# Patient Record
Sex: Female | Born: 1950 | Race: White | Hispanic: No
Health system: Northeastern US, Academic
[De-identification: ages and names within clinical notes are randomized; demographics above are authoritative.]

---

## 2022-05-08 ENCOUNTER — Inpatient Hospital Stay
Admit: 2022-05-08 | Discharge: 2022-05-09 | Disposition: A | Discharge status: Home / Self Care | Payer: PRIVATE HEALTH INSURANCE | Attending: Emergency Medicine

## 2022-05-08 ENCOUNTER — Emergency Department: Admit: 2022-05-08 | Payer: PRIVATE HEALTH INSURANCE

## 2022-05-08 DIAGNOSIS — J069 Acute upper respiratory infection, unspecified: Secondary | ICD-10-CM

## 2022-05-08 DIAGNOSIS — J0100 Acute maxillary sinusitis, unspecified: Secondary | ICD-10-CM

## 2022-05-08 LAB — COMPREHENSIVE METABOLIC PANEL
ALT: 23 U/L (ref 0–55)
AST: 22 U/L (ref 6–42)
Albumin: 3.4 g/dL (ref 3.2–5.0)
Alkaline phosphatase: 99 U/L (ref 30–130)
Anion Gap: 5 mmol/L (ref 3–14)
BUN: 22 mg/dL (ref 6–24)
Bilirubin, total: 0.2 mg/dL (ref 0.2–1.2)
CO2 (Bicarbonate): 29 mmol/L (ref 20–32)
Calcium: 9.2 mg/dL (ref 8.5–10.5)
Chloride: 105 mmol/L (ref 98–110)
Creatinine: 0.69 mg/dL (ref 0.55–1.30)
Glucose: 97 mg/dL (ref 70–110)
Potassium: 3.8 mmol/L (ref 3.6–5.2)
Protein, total: 7.6 g/dL (ref 6.0–8.4)
Sodium: 139 mmol/L (ref 135–146)
eGFRcr: 93 mL/min/{1.73_m2} (ref >=60)

## 2022-05-08 LAB — CBC WITH DIFFERENTIAL
Basophils %: 0.4 %
Basophils Absolute: 0.03 10*3/uL (ref 0.00–0.22)
Eosinophils %: 0.6 %
Eosinophils Absolute: 0.04 10*3/uL (ref 0.00–0.50)
Hematocrit: 40.2 % (ref 32.0–47.0)
Hemoglobin: 12.7 g/dL (ref 11.0–16.0)
Immature Granulocytes %: 0.4 %
Immature Granulocytes Absolute: 0.03 10*3/uL (ref 0.00–0.10)
Lymphocyte %: 17.6 %
Lymphocytes Absolute: 1.22 10*3/uL (ref 0.70–4.00)
MCH: 30.5 pg (ref 26.0–34.0)
MCHC: 31.6 g/dL (ref 31.0–37.0)
MCV: 96.6 fL (ref 80.0–100.0)
MPV: 9.8 fL (ref 9.1–12.4)
Monocytes %: 6.1 %
Monocytes Absolute: 0.42 10*3/uL (ref 0.36–0.77)
NRBC %: 0 % (ref 0.0–0.0)
NRBC Absolute: 0 10*3/uL (ref 0.00–2.00)
Neutrophil %: 74.9 %
Neutrophils Absolute: 5.18 10*3/uL (ref 1.50–7.95)
Platelets: 258 10*3/uL (ref 150–400)
RBC: 4.16 M/uL (ref 3.70–5.20)
RDW-CV: 13.1 % (ref 11.5–14.5)
RDW-SD: 46.7 fL (ref 35.0–51.0)
WBC: 6.9 10*3/uL (ref 4.0–11.0)

## 2022-05-08 MED ORDER — amoxicillin-pot clavulanate (Augmentin) 875-125 mg tablet
875-125 | ORAL_TABLET | Freq: Two times a day (BID) | ORAL | 0 refills | 7.00000 days | Status: AC
Start: 2022-05-08 — End: 2022-05-18

## 2022-05-08 MED ORDER — acetaminophen (Tylenol) 325 mg tablet  - Omnicell Override Pull
325 | ORAL | Status: AC
Start: 2022-05-08 — End: ?

## 2022-05-08 MED ORDER — acetaminophen (Tylenol) tablet 650 mg
325 | Freq: Once | ORAL | Status: AC
Start: 2022-05-08 — End: 2022-05-08
  Administered 2022-05-08: 23:00:00 650 mg via ORAL

## 2022-05-08 MED FILL — ACETAMINOPHEN 325 MG TABLET: 325 325 mg | ORAL | Qty: 2

## 2022-05-08 NOTE — Other (Signed)
Patient Education  Table of Contents   Sinus Infection, Adult   Infec?o sinusal, adultos (Sinus Infection, Adult)   Upper Respiratory Infection, Adult   Infec?o das vias a?reas superiores, adultos (Upper Respiratory Infection, Adult)    To view videos and all your education online visit,  [email or web address]  or scan this QR code with your smartphone.  Access to this content will expire in one year.  Sinus Infection, Adult    A sinus infection, also called sinusitis, is inflammation of your sinuses. Sinuses are hollow spaces in the bones around your face. Your sinuses are located:   Around your eyes.   In the middle of your forehead.   Behind your nose.   In your cheekbones.  Mucus normally drains out of your sinuses. When your nasal tissues become inflamed or swollen, mucus can become trapped or blocked. This allows bacteria, viruses, and fungi to grow, which leads to infection. Most infections of the sinuses are caused by a virus.  A sinus infection can develop quickly. It can last for up to 4 weeks (acute) or for more than 12 weeks (chronic). A sinus infection often develops after a cold.  What are the causes?  This condition is caused by anything that creates swelling in the sinuses or stops mucus from draining. This includes:   Allergies.   Asthma.   Infection from bacteria or viruses.   Deformities or blockages in your nose or sinuses.   Abnormal growths in the nose (nasal polyps).   Pollutants, such as chemicals or irritants in the air.   Infection from fungi. This is rare.  What increases the risk?  You are more likely to develop this condition if you:   Have a weak body defense system (immune system).   Do a lot of swimming or diving.   Overuse nasal sprays.   Smoke.  What are the signs or symptoms?  The main symptoms of this condition are pain and a feeling of pressure around the affected sinuses. Other symptoms include:   Stuffy nose or congestion that makes it difficult to breathe through  your nose.   Thick yellow or greenish drainage from your nose.   Tenderness, swelling, and warmth over the affected sinuses.   A cough that may get worse at night.   Decreased sense of smell and taste.   Extra mucus that collects in the throat or the back of the nose (postnasal drip) causing a sore throat or bad breath.   Tiredness (fatigue).   Fever.  How is this diagnosed?  This condition is diagnosed based on:   Your symptoms.   Your medical history.   A physical exam.   Tests to find out if your condition is acute or chronic. This may include:  ? Checking your nose for nasal polyps.  ? Viewing your sinuses using a device that has a light (endoscope).  ? Testing for allergies or bacteria.  ? Imaging tests, such as an MRI or CT scan.  In rare cases, a bone biopsy may be done to rule out more serious types of fungal sinus disease.  How is this treated?  Treatment for a sinus infection depends on the cause and whether your condition is chronic or acute.   If caused by a virus, your symptoms should go away on their own within 10 days. You may be given medicines to relieve symptoms. They include:  ? Medicines that shrink swollen nasal passages (decongestants).  ? A spray that eases inflammation  of the nostrils (topical intranasal corticosteroids).  ? Rinses that help get rid of thick mucus in your nose (nasal saline washes).  ? Medicines that treat allergies (antihistamines).  ? Over-the-counter pain relievers.   If caused by bacteria, your health care provider may recommend waiting to see if your symptoms improve. Most bacterial infections will get better without antibiotic medicine. You may be given antibiotics if you have:  ? A severe infection.   ? A weak immune system.   If caused by narrow nasal passages or nasal polyps, surgery may be needed.  Follow these instructions at home:  Medicines   Take, use, or apply over-the-counter and prescription medicines only as told by your health care provider. These may  include nasal sprays.   If you were prescribed an antibiotic medicine, take it as told by your health care provider. Do not stop taking the antibiotic even if you start to feel better.  Hydrate and humidify     Drink enough fluid to keep your urine pale yellow. Staying hydrated will help to thin your mucus.   Use a cool mist humidifier to keep the humidity level in your home above 50%.   Inhale steam for 10?15 minutes, 3?4 times a day, or as told by your health care provider. You can do this in the bathroom while a hot shower is running.   Limit your exposure to cool or dry air.  Rest   Rest as much as possible.   Sleep with your head raised (elevated).   Make sure you get enough sleep each night.  General instructions     Apply a warm, moist washcloth to your face 3?4 times a day or as told by your health care provider. This will help with discomfort.   Use nasal saline washes as often as told by your health care provider.   Wash your hands often with soap and water to reduce your exposure to germs. If soap and water are not available, use hand sanitizer.   Do not smoke. Avoid being around people who are smoking (secondhand smoke).   Keep all follow-up visits. This is important.  Contact a health care provider if:   You have a fever.   Your symptoms get worse.   Your symptoms do not improve within 10 days.  Get help right away if:   You have a severe headache.   You have persistent vomiting.   You have severe pain or swelling around your face or eyes.   You have vision problems.   You develop confusion.   Your neck is stiff.   You have trouble breathing.  These symptoms may be an emergency. Get help right away. Call 911.   Do not wait to see if the symptoms will go away.   Do not drive yourself to the hospital.  Summary   A sinus infection is soreness and inflammation of your sinuses. Sinuses are hollow spaces in the bones around your face.   This condition is caused by nasal tissues that become inflamed or  swollen. The swelling traps or blocks the flow of mucus. This allows bacteria, viruses, and fungi to grow, which leads to infection.   If you were prescribed an antibiotic medicine, take it as told by your health care provider. Do not stop taking the antibiotic even if you start to feel better.   Keep all follow-up visits. This is important.  This information is not intended to replace advice given to you by your  health care provider. Make sure you discuss any questions you have with your health care provider.  Document Released: 2005-02-12 Document Updated: 2021-01-17 Document Reviewed: 2021-01-17  Elsevier Patient Education ? 2024 Elsevier Inc.  Infec?o sinusal, adultos  Sinus Infection, Adult    Uma infec?o sinusal, tamb?m chamada de sinusite,  a inflama?o dos seios paranasais. Os seios paranasais s?o espa?os ocos nos ossos do rosto. Seus seios paranasais est?o localizados:   Ao redor [employer].   No meio da testa.   Atr?s do nariz.   Nos ossos das bochechas.  O muco normalmente  drenado para fora de seus seios paranasais. Quando seus tecidos nasais ficam inflamados ou inchados, o muco pode ficar preso ou bloqueado. Isso permite que bact?rias, v?rus e fungos se proliferem, o que provoca infec?o. A maioria das infec?es dos seios  causada por um v?rus.  A infec?o sinusal pode se desenvolver rapidamente. Pode durar at [employer] (aguda) ou mais de 12 semanas (cr?nica). A infec?o sinusal muitas vezes surge ap?s um resfriado.  Quais s?o as causas?  Esse quadro cl?nico  causado por qualquer coisa que produza um incha?o nos seios paranasais ou que impe?a que o muco seja drenado. Isso inclui:   Alergias.   Asma.   Infec?o por bact?rias ou v?rus.   Deformidades ou bloqueios no nariz ou nos seios paranasais.   Tumores anormais no nariz (p?lipos nasais).   Poluentes, como subst?ncias qu?micas ou irritantes presentes no ar.   Infec?o por fungos. Isso  raro.  O que aumenta o risco?  Voc ter maior probabilidade  de apresentar esse quadro cl?nico se:   Nurse, mental health com o sistema de defesa do corpo (sistema imune) enfraquecido.   Nadar ou mergulhar frequentemente.   Usar em Tree surgeon.   Fumar.  Quais s?o os sinais ou sintomas?  Os principais sintomas desse quadro cl?nico s?o dor e uma sensa?o de press?o na ?rea dos seios paranasais afetados. Outros sintomas incluem:   Nariz entupido ou congest?o que dificulta a respira?o pelo nariz.   Secre?o espessa amarela ou esverdeada do nariz.   Sensibilidade, incha?o e aumento da temperatura nos seios paranasais afetados.   Uma tosse que pode piorar  noite.   Redu?o do olfato e do paladar.   Excesso de muco que se acumula na garganta ou na parte posterior do nariz (gotejamento p?s-nasal), causando dor de garganta ou mau h?lito.   Cansa?o (fadiga).   Febre.  Como esse quadro cl?nico  diagnosticado?  Esse quadro cl?nico  diagnosticado com base em:   Seus sintomas.   Seu hist?rico m?dico.   Um exame f?sico.   Exames para descobrir se seu quadro cl?nico  agudo ou cr?nico. Isso pode incluir:  ? Examinar o nariz para ver se h p?lipos nasais.  ? Examinar a parte interior de seus seios paranasais usando um dispositivo com uma luz (endosc?pio).  ? Fazer testes para alergias ou bact?rias.  ? Exames de imagem, como uma resson?ncia magn?tica (RM) ou tomografia computadorizada (TC).  Em casos raros, uma bi?psia ?ssea poder ser realizada para descartar tipos mais s?rios de doen?as f?ngicas dos seios paranasais.  Como esse quadro cl?nico  tratado?  O tratamento da infec?o sinusal depende da causa do seu quadro cl?nico e de ele ser cr?nico ou agudo.   Se foi causada por um v?rus, seus sintomas devem desaparecer sozinhos dentro de 10 dias. Voc poder receber medicamentos para aliviar seus sintomas. Isso inclui:  ? Medicamentos que melhoram as cavidades nasais inchadas (descongestionantes).  ?  Um spray que alivia a [employer]	[place] (corticosteroides intranasais  t?picos).  ? Enxaguantes que ajudam a eliminar o muco espesso no nariz (soro fisiol?gico nasal).  ? Medicamentos para tratar alergias (anti-histam?nicos).  ? Analg?sicos sem necessidade de receita m?dica.   Se o quadro for causado por bact?rias, seu m?dico pode recomendar esperar para ver se os seus sintomas melhoram. A maioria das infec?es bacterianas melhora sem necessidade de antibi?tico. Voc pode ter que tomar antibi?tico se tiver:  ? Uma infec?o grave.   ? Um sistema imune enfraquecido.   Se o quadro for causado por cavidades nasais estreitas ou p?lipos nasais, cirurgia pode ser necess?ria.  Siga estas instru?es em casa:  [employer], use ou aplique medicamentos de venda livre ou vendidos com receita m?dica somente de acordo com as indica?es do seu m?dico. Esses medicamentos podem incluir sprays nasais.   Caso tenha recebido uma prescri?o de antibi?tico, tome-o conforme a orienta?o do seu m?dico. N?o pare de tomar o antibi?tico mesmo se come?ar a se Passenger transport manager.  Hidrata?o e umidifica?o     Beba l?quidos em quantidade suficiente para manter a urina na cor amarelo-p?lida. Permanecer hidratado ajudar a afinar o muco.   Use um umidificador de n?voa ?mida para manter o n?vel de umidade da sua casa acima de 50%.   Inale vapor por 10?15 minutos, 3?4 vezes por dia, ou de acordo com as orienta?es do seu m?dico. Voc pode fazer isso no banheiro com o chuveiro quente ligado.   Limite sua exposi?o a ar frio e seco.  Repouso   Repouse o m?ximo poss?vel.   Durma com a cabe?a erguida (elevada).   Certifique-se de dormir o bastante todas as noites.  Instru?es gerais     Aplique um pano quente e ?mido ao rosto 3?4 vezes ao dia ou de acordo com as instru?es do seu m?dico. Isso aliviar o desconforto.   Fa?a lavagens com solu?o salina nasal com a frequ?ncia determinada pelo seu m?dico.   Lave as m?os com ?gua e sab?o frequentemente para reduzir a exposi?o a germes. Caso ?gua e sab?o n?o estejam  dispon?veis, use gel antiss?ptico para as m?os.   N?o fume. Evitar ficar perto de fumantes (tabagismo passivo).   Compare?a a todas as consultas de acompanhamento. Isso  importante.  Entre em contato com um m?dico se:   Tiver febre.   Seus sintomas piorarem.   Seus sintomas n?o melhorarem em at [employer].  Busque ajuda imediatamente se:   Tiver dor de cabe?a intensa.   Vomitar de [employer].   Apresentar dor ou incha?o intensos no rosto ou em torno [employer].   Tiver problemas de vis?o.   Apresentar confus?o.   Seu pesco?o ficar r?gido.   Tiver dificuldade para respirar.  Esses sintomas podem ser uma emerg?ncia. Busque ajuda imediatamente. Ligue para 911.   N?o espere para ver se os sintomas desaparecem.   N?o dirija por conta pr?pria at o hospital.  Resumo   Infec?o sinusal significa dor e inflama?o de seus seios paranasais. Os seios paranasais s?o espa?os ocos nos ossos do rosto.   Esse quadro  causado por tecidos nasais que ficam inflamados ou inchados. O incha?o entope ou bloqueia o fluxo de muco. Isso permite que bact?rias, v?rus e fungos se proliferem, o que provoca infec?o.   Caso tenha recebido uma prescri?o de antibi?tico, tome-o conforme a orienta?o do seu m?dico. N?o pare de tomar o antibi?tico mesmo se come?ar a se Passenger transport manager.   Compare?a a todas  as consultas de acompanhamento. Isso  importante.  Estas informa?es n?o se destinam a substituir as recomenda?es de seu m?dico. N?o deixe de discutir quaisquer d?vidas com seu m?dico.  Document Released: 2005-02-12 Document Updated: 2021-02-02 Document Reviewed: 2021-02-02  Elsevier Patient Education ? [place].  Upper Respiratory Infection, Adult  An upper respiratory infection (URI) is a common viral infection of the nose, throat, and upper air passages that lead to the lungs. The most common type of URI is the common cold. URIs usually get better on their own, without medical treatment.  What are the causes?  A URI is caused by  a virus. You may catch a virus by:   Breathing in droplets from an infected person's cough or sneeze.   Touching something that has been exposed to the virus (is contaminated) and then touching your mouth, nose, or eyes.  What increases the risk?  You are more likely to get a URI if:   You are very young or very old.   You have close contact with others, such as at work, school, or a health care facility.   You smoke.   You have long-term (chronic) heart or lung disease.   You have a weakened disease-fighting system (immune system).   You have nasal allergies or asthma.   You are experiencing a lot of stress.   You have poor nutrition.  What are the signs or symptoms?  A URI usually involves some of the following symptoms:   Runny or stuffy (congested) nose.   Cough.   Sneezing.   Sore throat.   Headache.   Fatigue.   Fever.   Loss of appetite.   Pain in your forehead, behind your eyes, and over your cheekbones (sinus pain).   Muscle aches.   Redness or irritation of the eyes.   Pressure in the ears or face.  How is this diagnosed?  This condition may be diagnosed based on your medical history and symptoms, and a physical exam. Your health care provider may use a swab to take a mucus sample from your nose (nasal swab). This sample can be tested to determine what virus is causing the illness.  How is this treated?  URIs usually get better on their own within 7?10 days. Medicines cannot cure URIs, but your health care provider may recommend certain medicines to help relieve symptoms, such as:   Over-the-counter cold medicines.   Cough suppressants. Coughing is a type of defense against infection that helps to clear the respiratory system, so take these medicines only as recommended by your health care provider.   Fever-reducing medicines.  Follow these instructions at home:  Activity   Rest as needed.   If you have a fever, stay home from work or school until your fever is gone or until your health care provider says  your URI cannot spread to other people (is no longer contagious). Your health care provider may have you wear a face mask to prevent your infection from spreading.  Relieving symptoms   Gargle with a mixture of salt and water 3?4 times a day or as needed. To make salt water, completely dissolve ??1 tsp (3?6 g) of salt in 1 cup (237 mL) of warm water.   Use a cool-mist humidifier to add moisture to the air. This can help you breathe more easily.  Eating and drinking     Drink enough fluid to keep your urine pale yellow.   Eat soups and other clear broths.  General instructions     Take over-the-counter and prescription medicines only as told by your health care provider. These include cold medicines, fever reducers, and cough suppressants.   Do not use any products that contain nicotine or tobacco. These products include cigarettes, chewing tobacco, and vaping devices, such as e-cigarettes. If you need help quitting, ask your health care provider.   Stay away from secondhand smoke.   Stay up to date on all immunizations, including the yearly (annual) flu vaccine.   Keep all follow-up visits. This is important.  How to prevent the spread of infection to others    URIs can be contagious. To prevent the infection from spreading:   Wash your hands with soap and water for at least 20 seconds. If soap and water are not available, use hand sanitizer.   Avoid touching your mouth, face, eyes, or nose.   Cough or sneeze into a tissue or your sleeve or elbow instead of into your hand or into the air.  Contact a health care provider if:   You are getting worse instead of better.   You have a fever or chills.   Your mucus is brown or red.   You have yellow or brown discharge coming from your nose.   You have pain in your face, especially when you bend forward.   You have swollen neck glands.   You have pain while swallowing.   You have white areas in the back of your throat.  Get help right away if:   You have shortness of breath  that gets worse.   You have severe or persistent:  ? Headache.  ? Ear pain.  ? Sinus pain.  ? Chest pain.   You have chronic lung disease along with any of the following:  ? Making high-pitched whistling sounds when you breathe, most often when you breathe out (wheezing).  ? Prolonged cough (more than 14 days).  ? Coughing up blood.  ? A change in your usual mucus.   You have a stiff neck.   You have changes in your:  ? Vision.  ? Hearing.  ? Thinking.  ? Mood.  These symptoms may be an emergency. Get help right away. Call 911.   Do not wait to see if the symptoms will go away.   Do not drive yourself to the hospital.  Summary   An upper respiratory infection (URI) is a common infection of the nose, throat, and upper air passages that lead to the lungs.   A URI is caused by a virus.   URIs usually get better on their own within 7?10 days.   Medicines cannot cure URIs, but your health care provider may recommend certain medicines to help relieve symptoms.  This information is not intended to replace advice given to you by your health care provider. Make sure you discuss any questions you have with your health care provider.  Document Released: 2000-08-08 Document Updated: 2020-09-14 Document Reviewed: 2020-09-14  Elsevier Patient Education ? 2024 Elsevier Inc.  [employer] a?reas superiores, adultos  Upper Respiratory Infection, Adult  Uma infec?o das vias a?reas superiores (IVAS)  uma infec?o viral no nariz, garganta e vias a?reas que levam aos pulm?es. O tipo mais comum de infec?o [employer] a?reas superiores  a gripe comum. As IVAS geralmente melhoram sozinhas, sem tratamento m?dico.  Quais s?o as causas?  A infec?o das vias a?reas superiores  causada por um v?rus. Voc pode pegar um v?rus:   Aspirando  got?culas da [employer] espirros de uma pessoa infectada.   Tocando em um objeto que foi exposto ao v?rus (est contaminado) e depois tocando na sua boca, nariz ou olhos.  O que aumenta o risco?  Voc  tem mais chances de pegas uma IVAS se:   For muito jovem ou muito idoso.   Tiver contato pr?ximo com outras pessoas, como no trabalho, na escola ou em um centro de sa?de.   For fumante.   Tiver doen?a pulmonar ou card?aca de longo prazo (cr?nica).   Estiver com o sistema de defesa contra doen?as (sistema imune) enfraquecido.   Sofrer de Diplomatic Services operational officer ou asma.   Estiver passando por muito estresse.   Alimentar-se mal.  Quais s?o os sinais ou sintomas?  Uma IVAS geralmente envolve os seguintes sintomas:   Corrimento ou entupimento (congest?o) nasal.   Tosse.   Espirros.   Dor de garganta.   Dor de cabe?a.   Fadiga.   Febre.   Perda do apetite.   Dor na testa, entre os olhos e [employer] do rosto (dor nos seios paranasais).   Dores musculares.   Vermelhid?o ou irrita?o nos olhos.   Press?o nos ouvidos ou no rosto.  Como esse quadro cl?nico  diagnosticado?  Esse quadro cl?nico pode ser diagnosticado com base no seu hist?rico m?dico, sintomas e em um exame f?sico. Seu m?dico pode usar um cotonete para coletar uma amostra de muco do nariz (esfrega?o nasal). Essa amostra pode ser [place] para determinar qual v?rus est causando a doen?a.  Como esse quadro cl?nico  tratado?  As IVAS geralmente melhoram sozinhas depois de 7?10 dias. Rem?dios n?o curam IVAS, mas seu m?dico pode recomendar certos medicamentos para aliviar os sintomas, como:   Medicamentos de venda livre para gripe.   Antituss?genos. A tosse  um tipo de defesa contra a infec?o que ajuda a limpar o sistema respirat?rio, portanto, tome esses medicamentos apenas conforme orientado pelo seu m?dico.   Medicamentos de redu?o de febre.  Siga estas instru?es em casa:  Atividades   Repouse conforme necess?rio.   Se voc tiver febre, n?o v para o trabalho ou escola, fique em casa at a febre passar ou at [employer] n?o h risco de voc passar a IVAS para os outros (n?o  mais contagioso). Seu m?dico pode pedir que voc use uma m?scara facial  para evitar espalhar a infec?o.  Como Paramedic os sintomas   Fa?a gargarejo com uma mistura de ?gua e sal 3?4 vezes por dia ou conforme necess?rio. Para fazer uma mistura de ?gua e sal, dissolva completamente de ??1 colher de ch de sal (3?6 g) em 1 x?cara (237 ml) de ?gua morna.   Use um umidificador com n?voa fria para aumentar a umidade do ar. Isso pode ajud?-lo a Medical sales representative.  Alimentos e bebidas     Beba l?quidos em quantidade suficiente para manter a urina na cor amarelo-p?lida.   Coma sopas e outros caldos.  Instru?es gerais     Tome medicamentos vendidos com ou sem receita m?dica somente de acordo com as indica?es do seu m?dico. Eles incluem medicamentos para resfriado, para febre e para tosse.   N?o use produtos que contenham nicotina ou tabaco. Esses produtos incluem cigarros tradicionais, fumo de mascar e cigarros eletr?nicos. Caso precise de ajuda para parar de fumar, fale com seu m?dico.   Afaste-se do fumo passivo.   Mantenha todas as imuniza?es em dia, incluindo a vacina contra a gripe anual (sazonal).  Compare?a a todas as consultas de acompanhamento. Isso  importante.  Como evitar que a infec?o passe para outras pessoas    As IVASs podem ser contagiosas. Para evitar espalhar a infec?o:   Lave as m?os com ?gua e sab?o durante pelo menos 20 segundos. Caso ?gua e sab?o n?o estejam dispon?veis, use gel antiss?ptico para as m?os.   Evite tocar [employer], rosto, olhos ou [place].   Ao tossir ou espirrar, proteja com um len?o de papel ou a Print production planner camisa ou cotovelo, em vez de usar sua m?o ou expelir no ar.  Entre em contato com um m?dico se:   Academic librarian piorando em vez de Careers information officer.   Tiver febre ou calafrios.   Seu muco ficar marrom ou vermelho.   Uma secre?o amarela ou marrom come?ar a sair do seu nariz.   Voc sentir dor no rosto, especialmente ao se inclinar para frente.   Apresentar gl?ndulas inchadas no pesco?o.   Sentir Designer, jewellery.   Notar manchas brancas na parte posterior da  garganta.  Busque ajuda imediatamente se:   Tiver falta de ar e estiver piorando.   Apresentar de Len Blalock intensa ou persistente:  ? Dor de cabe?a.  ? Dor de ouvido.  ? Dores nos seios paranasais.  ? Dor no peito.   Tiver alguma doen?a cr?nica no pulm?o e apresentar algum dos sintomas a seguir:  ? Emitir ru?dos agudos de assobio ao respirar, mais frequentemente ao expirar (respira?o ruidosa).  ? Tosse prolongada (mais de 14 dias).  ? Tosse com sangue.  ? Altera?o do muco usual.   Apresentar rigidez no pesco?o.   Altera?es em:  ? Vis?o.  ? Audi?o.  ? Racioc?nio.  ? Humor.  Esses sintomas podem ser uma emerg?ncia. Obtenha ajuda imediatamente. Ligue para 911.   N?o espere para ver se os sintomas desaparecem.   N?o dirija por conta pr?pria at o hospital.  Resumo   Uma infec?o das vias a?reas superiores (IVAS)  uma infec?o comum no nariz, garganta e vias a?reas que levam aos pulm?es.   A infec?o das vias a?reas superiores  causada por um v?rus.   As IVAS geralmente melhoram sozinhas depois de 7?10 dias.   Rem?dios n?o curam IVAS, mas seu m?dico pode recomendar certos medicamentos para aliviar os sintomas.  Estas informa?es n?o se destinam a substituir as recomenda?es de seu m?dico. N?o deixe de discutir quaisquer d?vidas com seu m?dico.  Document Released: 2005-02-12 Document Updated: 2020-10-10 Document Reviewed: 2020-10-10  Elsevier Patient Education ? [place].

## 2022-05-08 NOTE — Discharge Instructions (Addendum)
Follow-up with your doctor at home, there is possibility of chronic obstructive pulmonary disease on the chest x-ray, return if you have any shortness of breath, new chest pain or new concerns.

## 2022-05-08 NOTE — ED Notes (Signed)
I introduced myself to the patient and her daughter. Pt does not appear in any immediate distress and is aware that if she should have any needs to alert the staff immediately.      Bebe Shaggy, RN  05/08/22 [phone]

## 2022-05-08 NOTE — ED Provider Notes (Signed)
[facility]  [place] [address]  05/08/22  10:35 PM     PATIENT  Miranda Love  DOB: [birth year], MRN: [id number]  History source: Patient, family, medical records  Arrival: private vehicle  History limitation: none    CHIEF COMPLAINT  Miranda Love is a 72 y.o. female who presents to the ER for Flu Symptoms  Patient seen and Miranda Love 30 of 10:20 PM, family declined translator    HISTORY OF PRESENT ILLNESS  HPI  The patient is a 72 y.o. female  has no past medical history on file. who presents to the ER for evaluation of Flu Symptoms  72 year old female with a history of hypothyroidism, here with over a week of flulike symptoms.  She had been visiting family and was due to go back to [place] in about a week, started getting nasal congestion with postnasal drip, increased cough causing posttussive emesis, now with fevers and bodyaches.  She denies any shortness of breath, chest pain abdominal pain, diarrhea.  She has some chronic leg edema which is unchanged, due to lymphedema, not CHF.  She denies any wheezing, no pleuritic pain, no history of COPD, CHF, smoking or living with a smoker.  She does have some sore throat as well.      REVIEW OF SYSTEMS  Review of Systems   Constitutional: Positive for fever.   HENT: Positive for congestion, postnasal drip, sinus pressure and sore throat. Negative for trouble swallowing and voice change.    Respiratory: Positive for cough. Negative for shortness of breath and stridor.    Gastrointestinal: Negative for abdominal pain, diarrhea and nausea.   Genitourinary: Negative for decreased urine volume, difficulty urinating and dysuria.   Musculoskeletal: Positive for myalgias. Negative for back pain.   Neurological: Negative for syncope and headaches.     A 10 point review of systems was otherwise negative other than pertinent positives and negatives as noted in HPI, including Const, Head, Neck, HENT, CV, Pulm, GI, GU, Skin, MSK, Neuro, Psych.      HEALTH STATUS  No Known Allergies     There is no immunization history on file for this patient.       PATIENT HISTORY  MEDICAL PROBLEMS  No past medical history on file.  SURGERIES  No past surgical history on file.  FAMILY HISTORY  No family history on file.  SOCIAL HISTORY  Social History     Tobacco Use   . Smoking status: Not on file   . Smokeless tobacco: Not on file   Substance Use Topics   . Alcohol use: Not on file   . Drug use: Not on file       PHYSICAL EXAM  FIRST VITAL SIGNS  Temp: 38.1 C (100.5 F)    Pulse: 91  BP: (!) 146/71  Resp: 18  SpO2: (!) 94 %    Glasgow Coma Scale Score: 15    Physical Exam  Vitals and nursing note reviewed.   Constitutional:       General: She is not in acute distress.     Appearance: Normal appearance. She is well-developed.   HENT:      Head: Normocephalic and atraumatic.      Nose: Congestion present.      Mouth/Throat:      Mouth: Mucous membranes are moist.      Pharynx: Oropharynx is clear. Posterior oropharyngeal erythema present. No oropharyngeal exudate.   Eyes:      Extraocular Movements:  Extraocular movements intact.   Cardiovascular:      Rate and Rhythm: Normal rate and regular rhythm.      Heart sounds: No murmur heard.  Pulmonary:      Effort: Pulmonary effort is normal. No respiratory distress.      Breath sounds: Normal breath sounds. No wheezing or rhonchi.   Abdominal:      General: Abdomen is flat.      Palpations: Abdomen is soft.      Tenderness: There is no abdominal tenderness.   Musculoskeletal:         General: Normal range of motion.      Cervical back: Normal range of motion. No rigidity or tenderness.   Skin:     General: Skin is warm and dry.      Capillary Refill: Capillary refill takes less than 2 seconds.   Neurological:      General: No focal deficit present.      Mental Status: She is alert and oriented to person, place, and time.   Psychiatric:         Mood and Affect: Mood normal.         Behavior: Behavior normal.          MEDICAL DECISION MAKING  Medical Decision Making  Patient here with URI symptoms, differential diagnosis includes flu, RSV, COVID-19, pneumonia, bronchitis, sinusitis, will check chest x-ray, CBC, CMP, viral swab.    Lungs are clear to auscultation, no history of CHF, no pitting edema, no chest pain to suggest PE or pneumothorax, no history of COPD and no wheezing.  No ripping or tearing pain to suggest aneurysm or dissection, patient comfortable lying on the stretcher in no acute distress    Amount and/or Complexity of Data Reviewed  Independent Historian:      Details: Family  Labs: ordered. Decision-making details documented in ED Course.  Radiology: ordered and independent interpretation performed. Decision-making details documented in ED Course.      Risk  OTC drugs.         Reviewed and confirmed history obtained in nursing triage notes regarding PMH and chart review performed of prior records as available to obtain additional history, past social history, and past family history as needed.    RESULTS  No orders to display       Labs Reviewed   SARS/FLU/RSV - Normal       Result Value    SARS-CoV-2 RNA PCR Not Detected      Influenza A RNA Not Detected      Influenza B RNA Not Detected      Respiratory syncytial virus Not Detected     CBC W/DIFF    Narrative:     The following orders were created for panel order CBC and differential.  Procedure                               Abnormality         Status                     ---------                               -----------         ------  CBC w/ Differential[[id number]]                              Final result                 Please view results for these tests on the individual orders.   COMPREHENSIVE METABOLIC PANEL    Sodium 782      Potassium 3.8      Chloride 105      CO2 (Bicarbonate) 29      Anion Gap 5      BUN 22      Creatinine 0.69      eGFRcr 93      Glucose 97      Fasting? Unknown      Calcium 9.2      AST 22      ALT 23       Alkaline phosphatase 99      Protein, total 7.6      Albumin 3.4      Bilirubin, total 0.2     CBC WITH DIFFERENTIAL    WBC 6.9      RBC 4.16      Hemoglobin 12.7      Hematocrit 40.2      MCV 96.6      MCH 30.5      MCHC 31.6      RDW-CV 13.1      RDW-SD 46.7      Platelets 258      MPV 9.8      Neutrophil % 74.9      Lymphocyte % 17.6      Monocytes % 6.1      Eosinophils % 0.6      Basophils % 0.4      Immature Granulocytes % 0.4      NRBC % 0.0      Neutrophils Absolute 5.18      Lymphocytes Absolute 1.22      Monocytes Absolute 0.42      Eosinophils Absolute 0.04      Basophils Absolute 0.03      Immature Granulocytes Absolute 0.03      NRBC Absolute 0.00        Labs were reviewed and were grossly normal with normal white count, negative COVID flu and RSV  XR CHEST 2 VIEWS   Final Result      1.  Pulmonary hyperinflation which could represent COPD.   2.  No radiographically apparent acute cardiopulmonary process.      Sherri Rad, MD 05/08/2022 7:40 PM        Images were reviewed independently by me with no acute infiltrate noted, no pneumothorax    ED TREATMENTS  Medications   acetaminophen (Tylenol) tablet 650 mg (650 mg oral Given 05/08/22 1925)              REEVALUATION/CONSULTS  ED Course as of 05/08/22 2246   Tue May 08, 2022   2241 Will treat for sinusitis as she is having significant postnasal drip, ongoing symptoms and a fever here.  Will treat with Augmentin, they understand to return if worsening symptoms or new concerns   2244 Saturating 94% with no shortness of breath, wheezing or infiltrate.  Patient ambulated in the department with no increased respiratory effort was quite comfortable.  Chest x-ray suggest COPD, no indication for advanced imaging at this time I recommend she follow-up with your doctor home  Diagnoses as of 05/08/22 2246   Acute non-recurrent maxillary sinusitis   Viral upper respiratory tract infection        Final diagnoses:   [J01.00] Acute non-recurrent maxillary  sinusitis   [J06.9] Viral upper respiratory tract infection        DISCHARGE PLAN  CONDITION: Stable  DISPOSITION: Discharge       Medication List      START taking these medications    . amoxicillin-pot clavulanate 875-125 mg tablet; Commonly known as:   Augmentin; Take 1 tablet by mouth twice daily for 10 days.         Neill Loft, MD  05/08/22 2246

## 2022-05-08 NOTE — ED Notes (Signed)
Pt is seated in bed not appearing in any immediate distress.      Bebe Shaggy, RN  05/08/22 2138

## 2022-05-08 NOTE — ED Triage Notes (Addendum)
Speaks Portuguese:  From [place].... got here February 5th.Marland KitchenMarland KitchenSick for 8 days.....coughing alot...body aches... runny nose.     Febrile in triage.  Sat 94%

## 2022-05-09 LAB — LIGHT BLUE TOP

## 2022-05-09 LAB — SARS/FLU/RSV
Influenza A RNA: NOT DETECTED
Influenza B RNA: NOT DETECTED
Respiratory syncytial virus: NOT DETECTED
SARS-CoV-2 RNA PCR: NOT DETECTED

## 2022-05-09 LAB — RAINBOW DRAW SST GOLD TOP

## 2023-10-10 IMAGING — MR RM JOELHO DIREITO
7 series · 29 of 40 positions shown · non-contrast
Comparison: none

[Series 401: T2 fat-sat · axial · 4.0mm · 0.28mm/px · z∈[-45,+58]mm · 3 of 22 slices shown (1 of 2)]
[im 1/22]
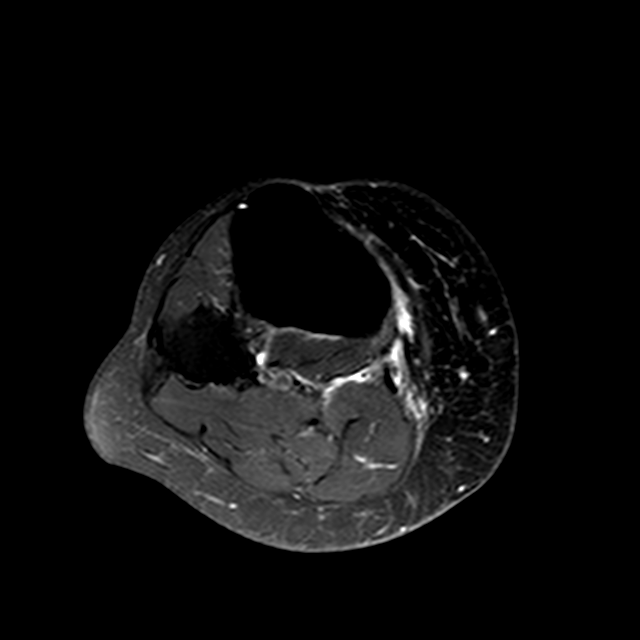
[im 11/22]
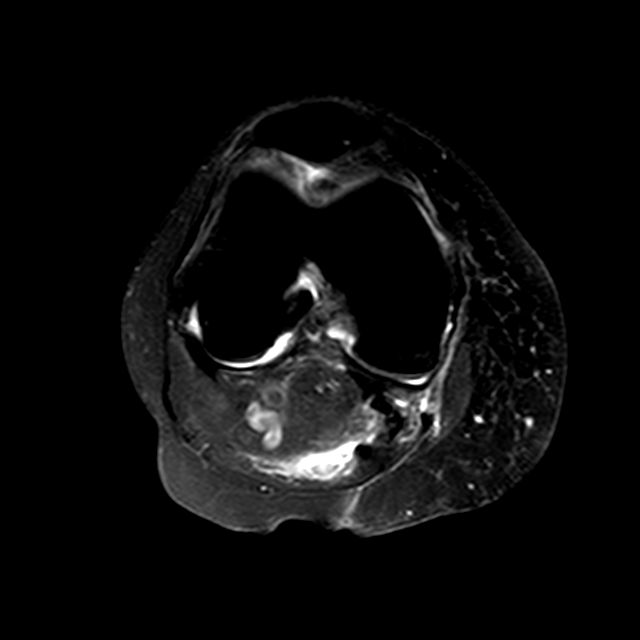
[im 22/22]
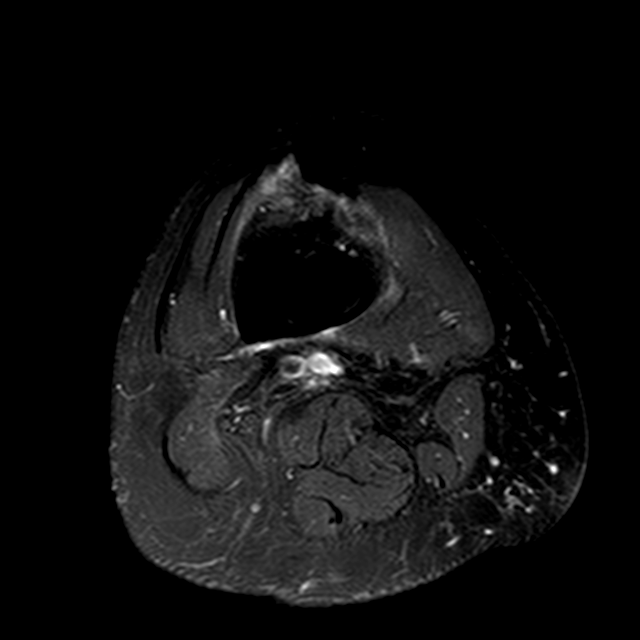

[Series 501: sag dp · sagittal · 4.0mm · 0.31mm/px · 1 of 18 slices shown]
[im 1/18]
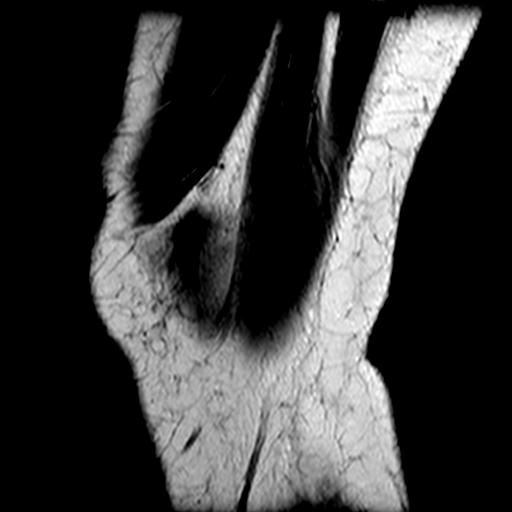

[Series 601: (id) spir · sagittal · 4.0mm · 0.30mm/px · 1 of 18 slices shown]
[im 1/18]
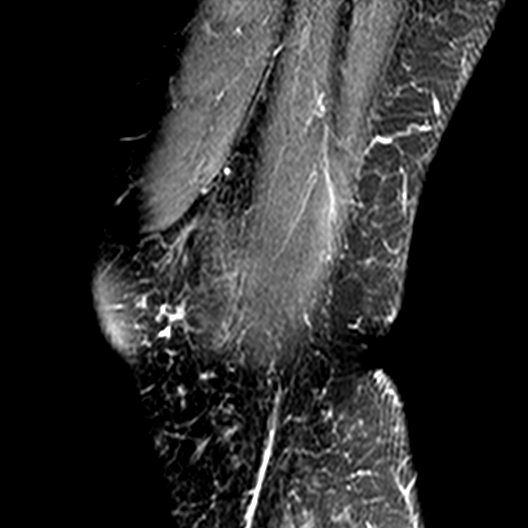

[Series 701: T2 fat-sat · coronal · 4.1mm · 0.23mm/px · 1 of 19 slices shown (2 of 2)]
[im 1/19]
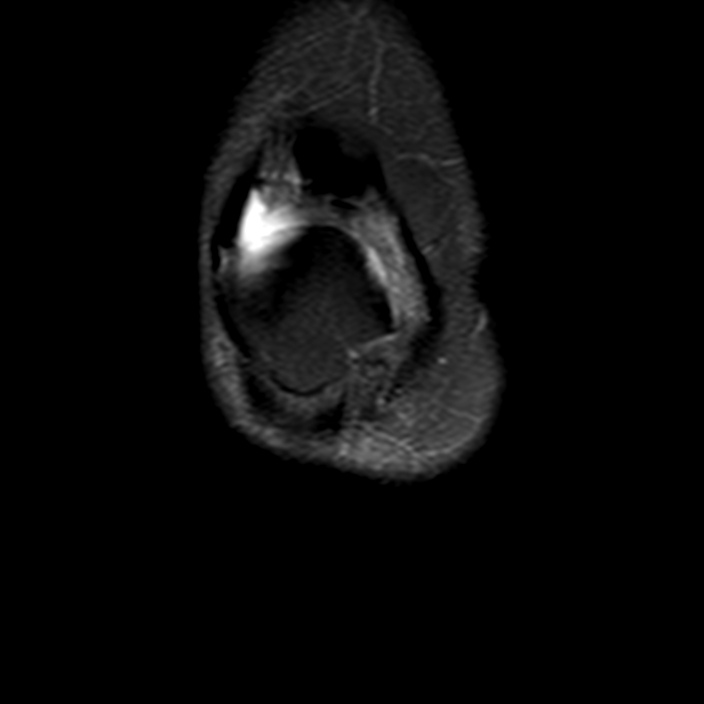

[Series 801: T1 · coronal · 4.0mm · 0.29mm/px · 1 of 19 slices shown]
[im 1/19]
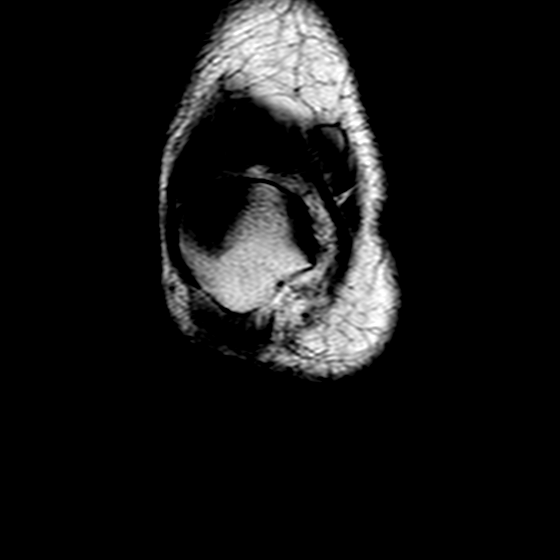

[Series 901: lig dp · coronal · 2.5mm · 0.23mm/px · 1 of 9 slices shown]
[im 1/9]
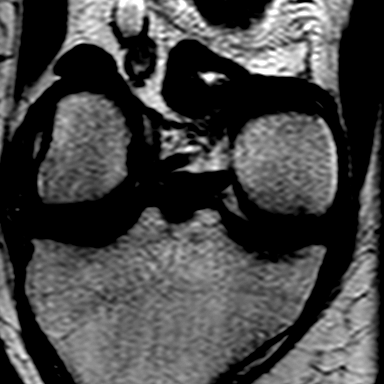

[Series 1101: pdw_vista_spair · sagittal · 0.7mm · 0.49mm/px · 21 of 414 slices shown]
[im 1/414]
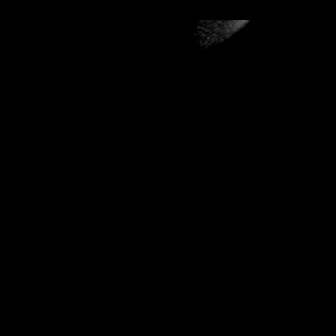
[im 14/414]
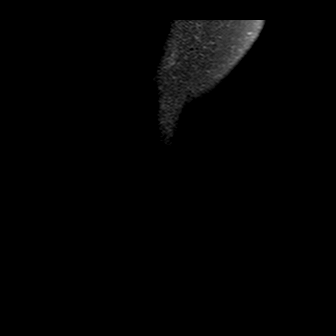
[im 27/414]
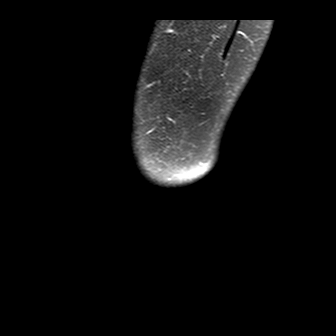
[im 40/414]
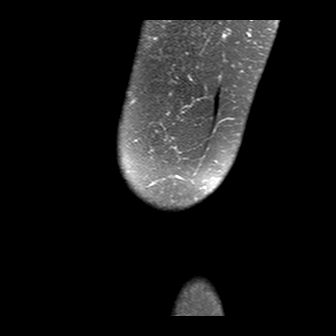
[im 54/414]
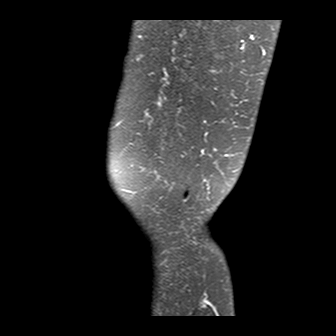
[im 67/414]
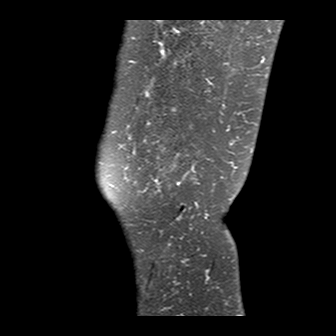
[im 80/414]
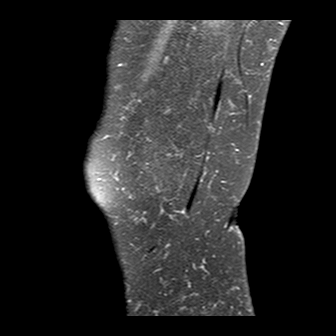
[im 94/414]
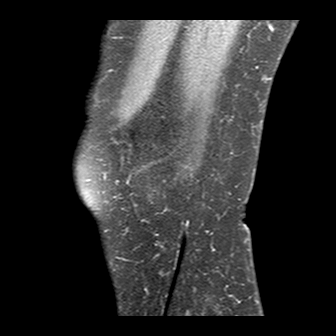
[im 107/414]
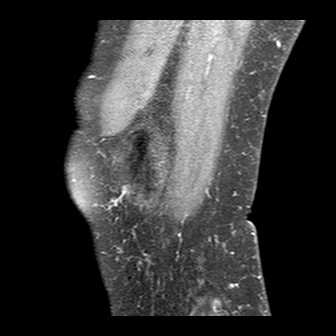
[im 120/414]
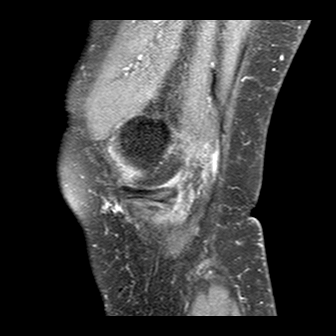
[im 134/414]
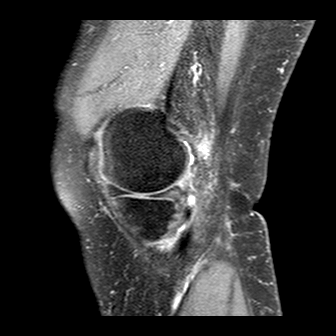
[im 147/414]
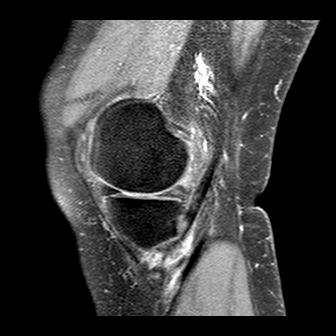
[im 160/414]
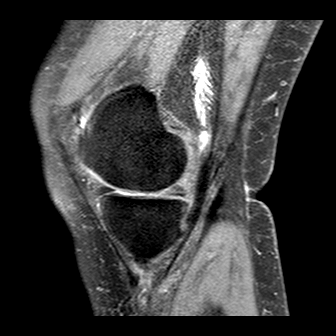
[im 174/414]
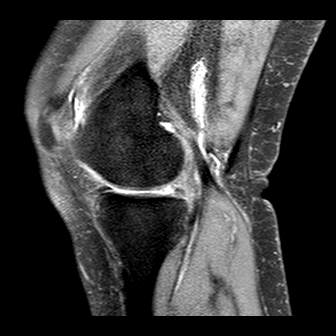
[im 187/414]
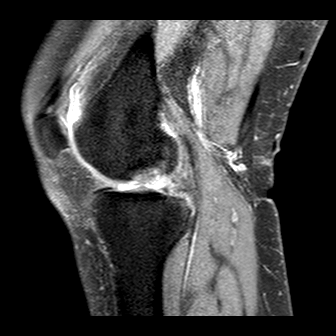
[im 200/414]
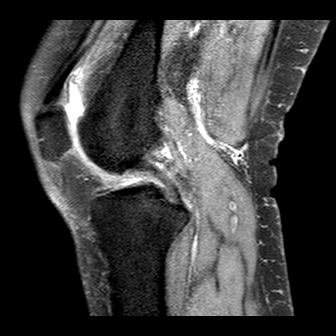
[im 214/414]
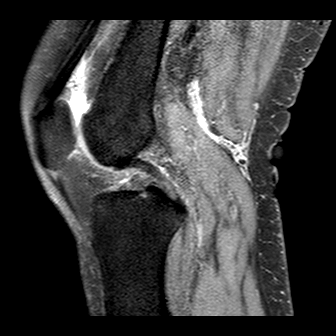
[im 240/414]
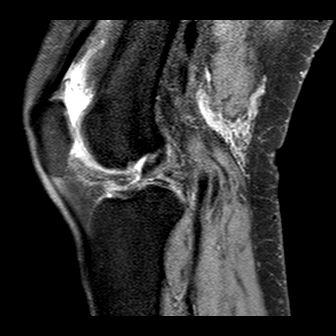
[im 294/414]
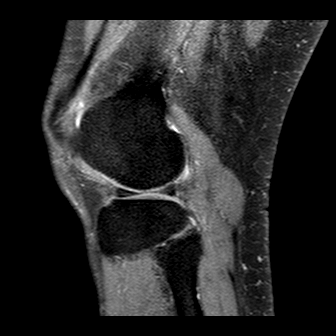
[im 347/414]
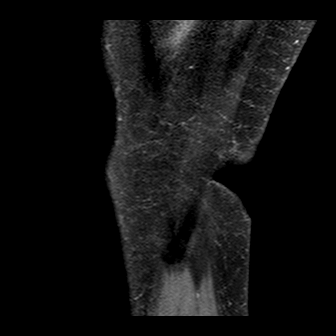
[im 400/414]
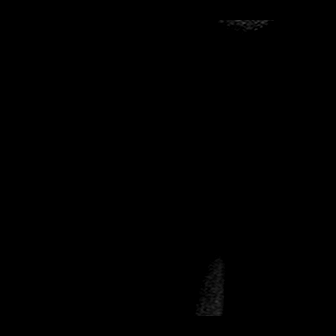

[29 of 40 positions shown; findings below may reference images not displayed]

Técnica:
Exame realizado pela técnica de fast spin echo com imagens obtidas predominantemente em T1 e T2, em aquisições
multiplanares.

Análise:
RESSONÂNCIA MAGNÉTICA DO JOELHO DIREITO
Rotura  radial  parcial  da  inserção  da  raiz  posterior  do  menisco  medial,  com  extrusão  deste  menisco  em  relação  a
interlinha articular.
Rotura oblíqua do corpo e corno posterior do menisco lateral, que se comunica com a superfície articular inferior.
Ligamentos cruzados e colaterais com continuidade, espessura e sinal conservados.
Pequeno derrame articular.
Discreta tendinopatia da origem do patelar.
Tendinopatia insercional do semimembranoso.
Cisto na bursa do gastrocnêmio medial / semimembranoso, com edema dos planos adiposos adjacentes, compatível
com rotura parcial.
Fissuras condrais superﬁciais no vértice e início das facetas patelares, sem alteração de sinal do osso subcondral.
Discretas alterações degenerativas do compartimento femorotibial medial, notando-se aﬁlamento condral do côndilo
femoral  e  planalto  tibial  em  área  de  carga,  com  edema  ósseo  subcondral  relacionado  à  sobrecarga  mecânica  e
osteóﬁtos marginais.
Tendões quadricipital, patelar, bíceps femoral distal, trato iliotibial e tendões da pata de ganso sem particularidades.

Conclusão:
Rotura radial parcial da inserção da raiz posterior do menisco medial.
Rotura oblíqua do corpo e corno posterior do menisco lateral.
Pequeno derrame articular.
Discreta tendinopatia da origem do patelar.
Tendinopatia insercional do semimembranoso.
Cisto de [place], com edema dos planos adiposos adjacentes, compatível com rotura parcial.
Condropatia patelar.
Discretas alterações degenerativas do compartimento femorotibial medial.

## 2023-10-10 IMAGING — CR RX JOELHO
6 series · 6 of 6 positions shown · non-contrast
Comparison: none

[lat (1 of 2)]
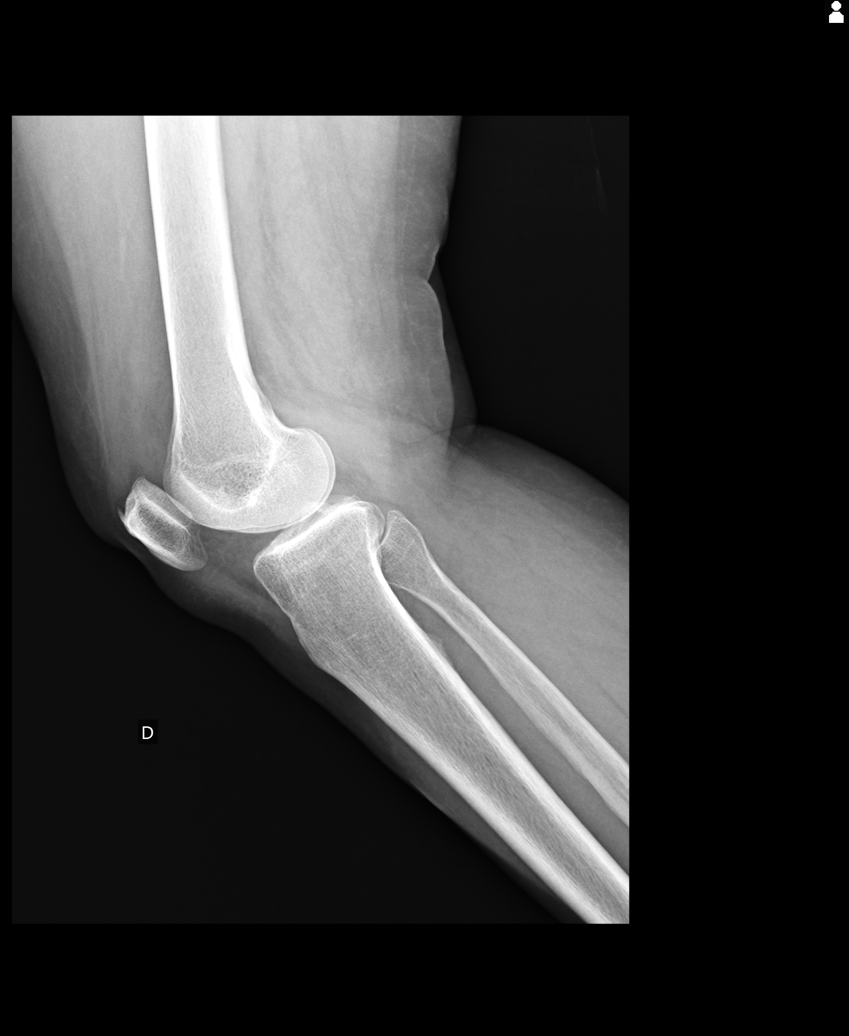

[ap (1 of 4)]
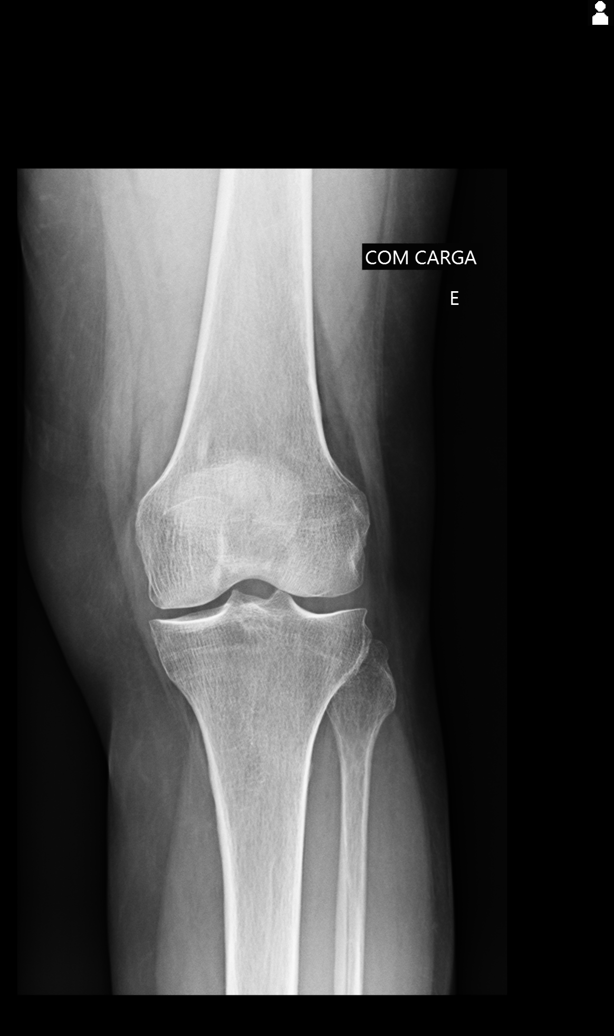

[lat (2 of 2)]
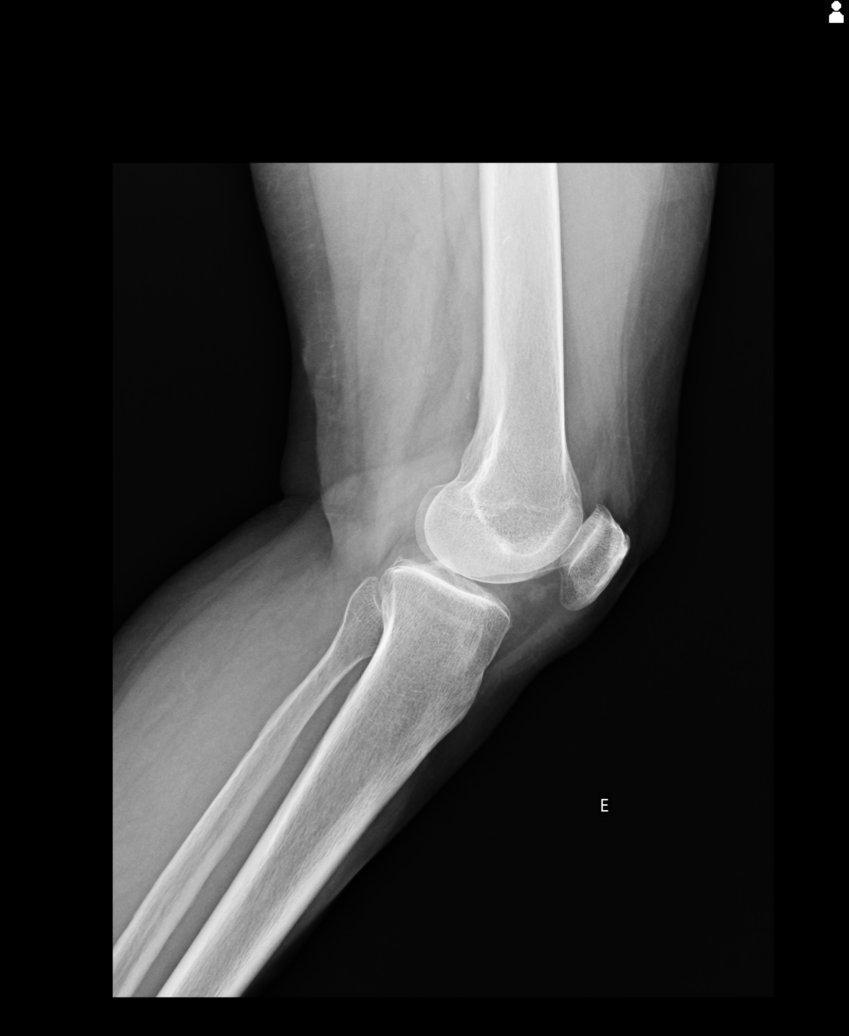

[ap (2 of 4)]
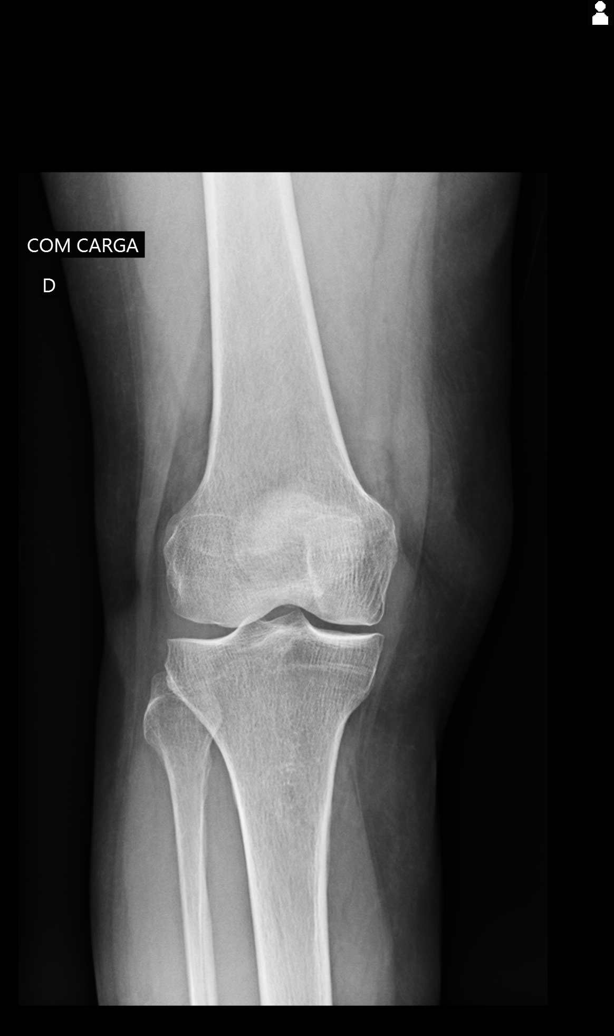

[ap (3 of 4)]
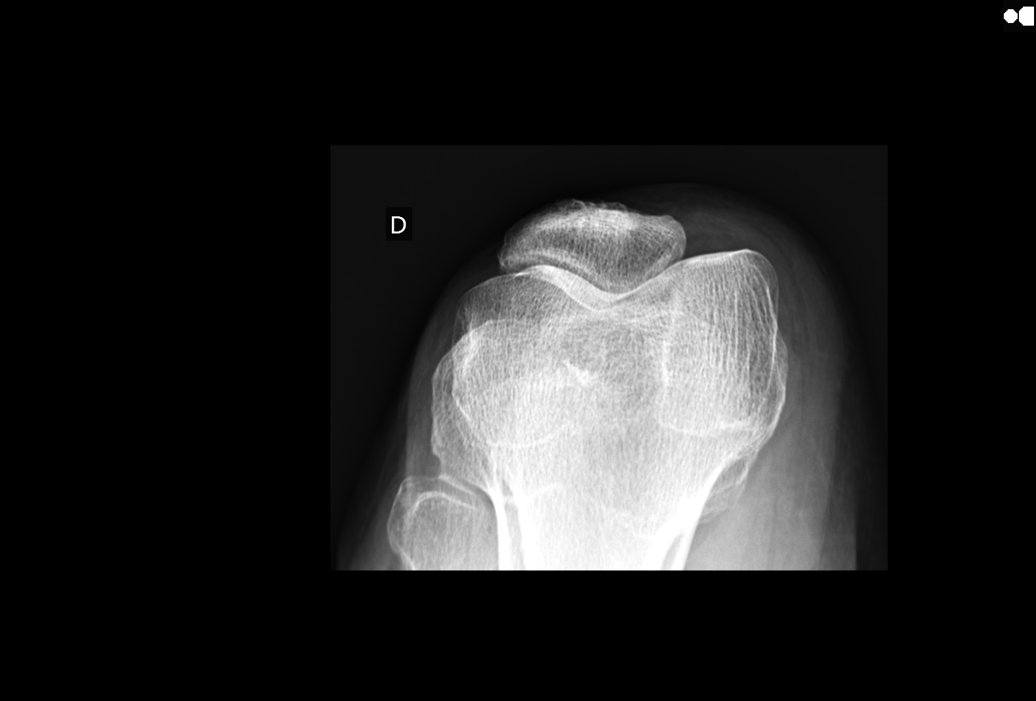

[ap (4 of 4)]
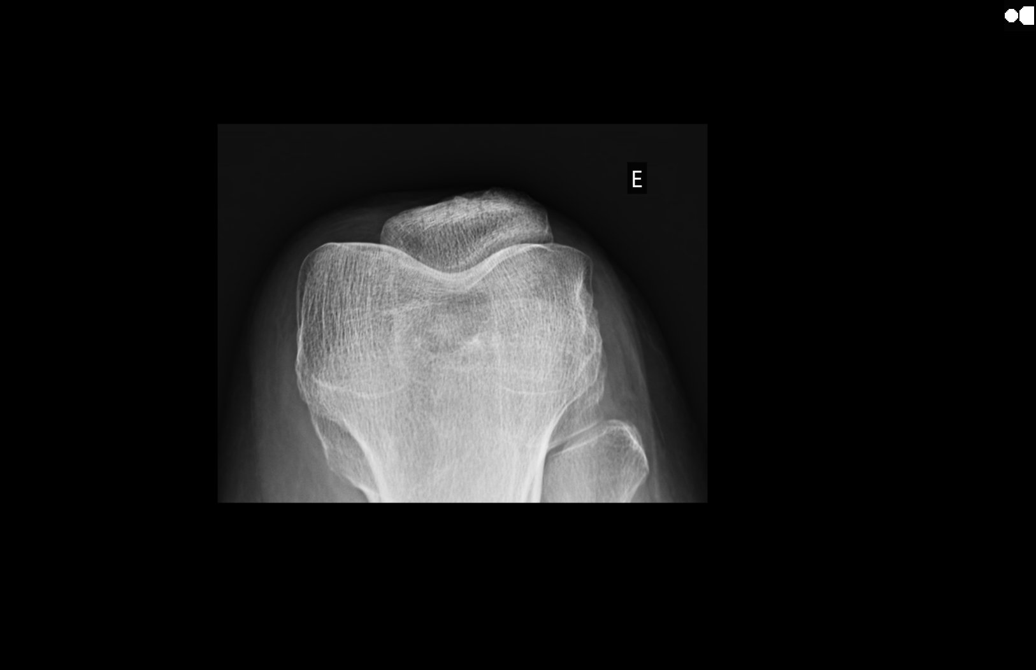

[6 of 6 positions shown; findings below may reference images not displayed]

------------- REPORT [id number] -------------
Report:
Bone alignment preserved.
Enthesophyte at the patellar insertion of the quadriceps tendon.
Mild narrowing of the medial femorotibial space.
Normal soft tissues.
DIGITAL RADIOGRAPH OF THE RIGHT KNEE
AP, PROFILE WITH SUPPORT AND AXIAL PATELLA


------------- REPORT [id number] -------------
Report:
Anatomical bone structure.
Joint spaces preserved.
Normal soft tissues.
DIGITAL X-RAY OF THE LEFT KNEE
AP, PROFILE WITH SUPPORT AND AXIAL PATELLA

## 2023-12-20 IMAGING — MR CRANIO
7 of 11 series · 23 of 48 positions shown · non-contrast
Comparison: none

[Series 1: loc airx · axial · 15.0mm · 1.17mm/px · z∈[-80,+149]mm · 2 of 26 slices shown]
[im 1/26]
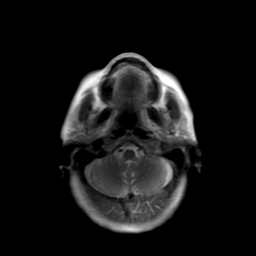
[im 26/26]
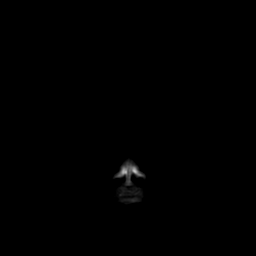

[Series 3: T2 · axial · 5.0mm · 0.47mm/px · 1 of 26 slices shown]
[im 1/26]
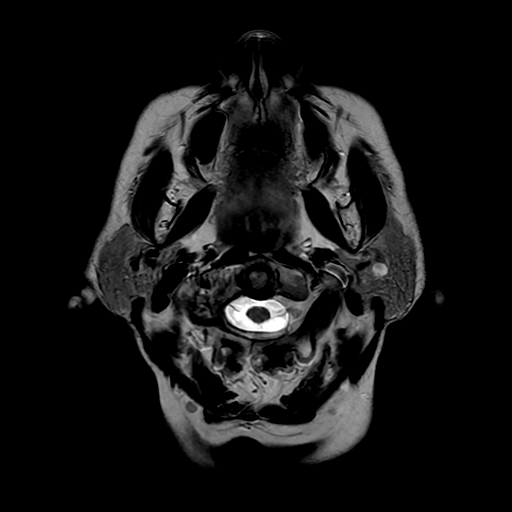

[Series 4: FLAIR · axial · 5.0mm · 0.47mm/px · 1 of 26 slices shown]
[im 1/26]
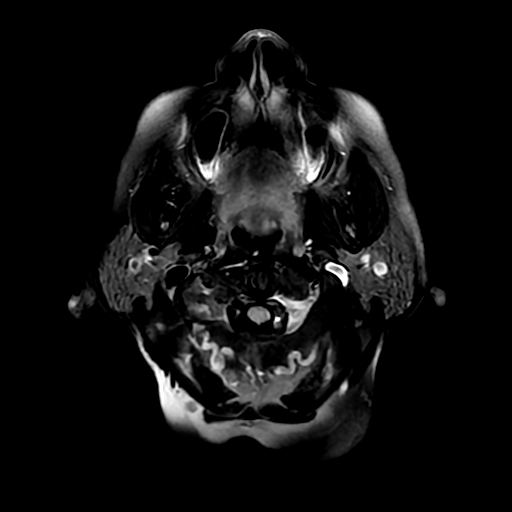

[Series 6: ax difusao · axial · 5.0mm · 0.94mm/px · z∈[-54,+87]mm · 3 of 52 slices shown]
[im 1/52]
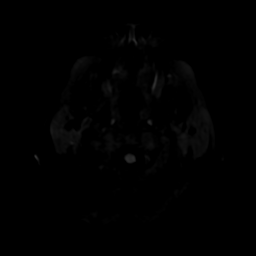
[im 26/52]
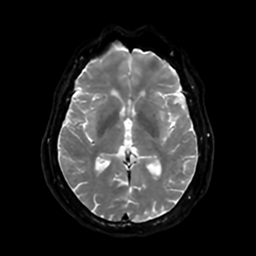
[im 52/52]
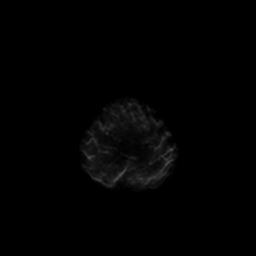

[Series 200: sagital (person_name) · sagittal · 1.1mm · 0.50mm/px · 8 of 218 slices shown]
[im 1/218]
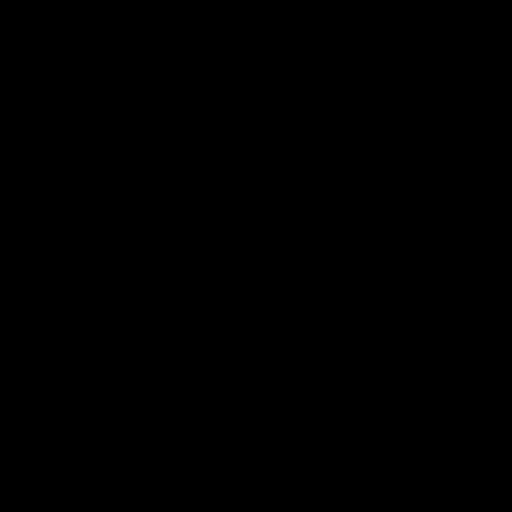
[im 44/218]
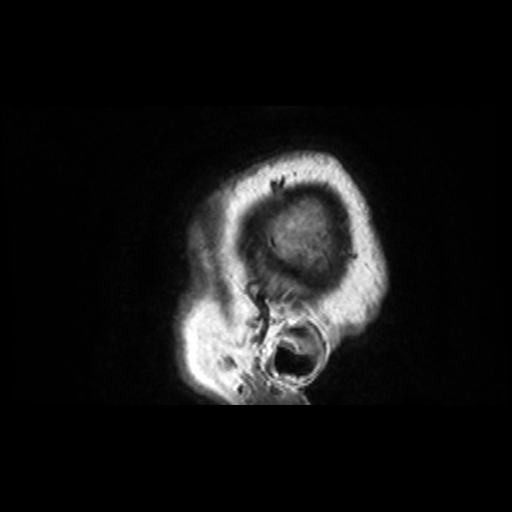
[im 66/218]
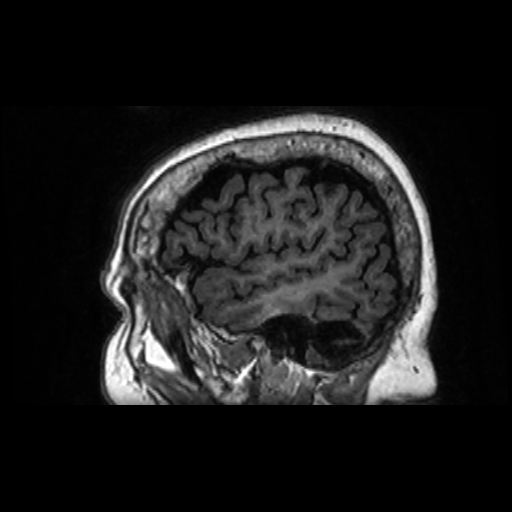
[im 87/218]
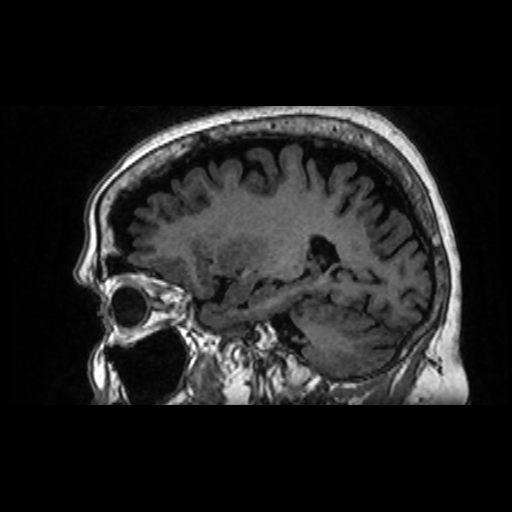
[im 131/218]
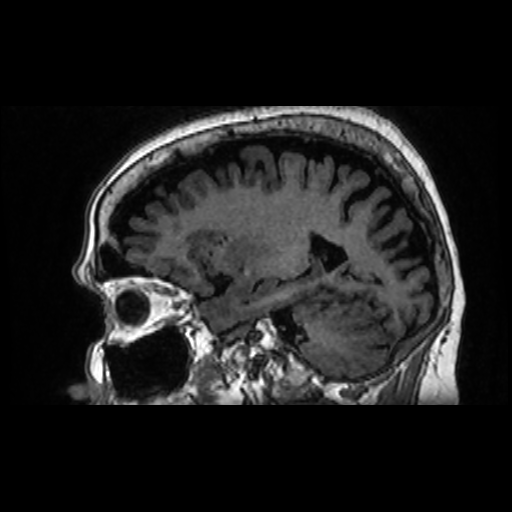
[im 152/218]
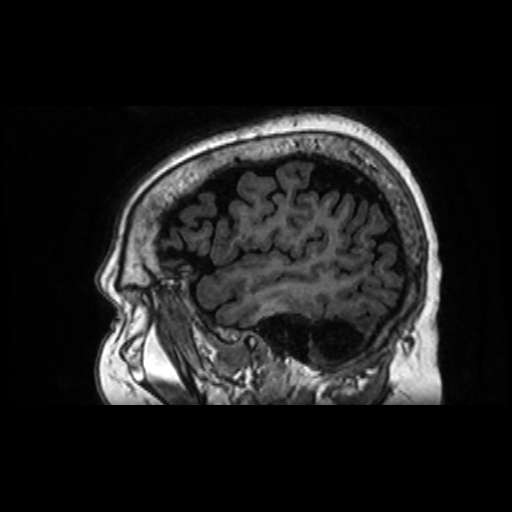
[im 174/218]
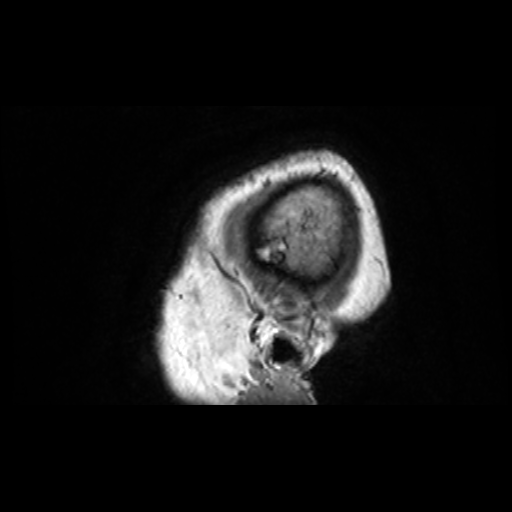
[im 218/218]
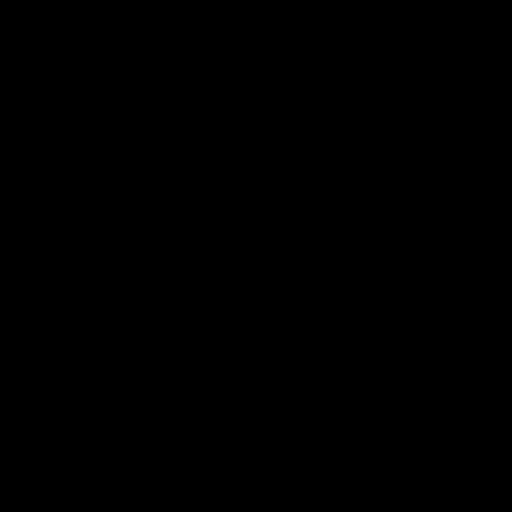

[Series 501: SWI · axial · 11.0mm · 0.47mm/px · z∈[-51,+83]mm · 7 of 143 slices shown]
[im 1/143]
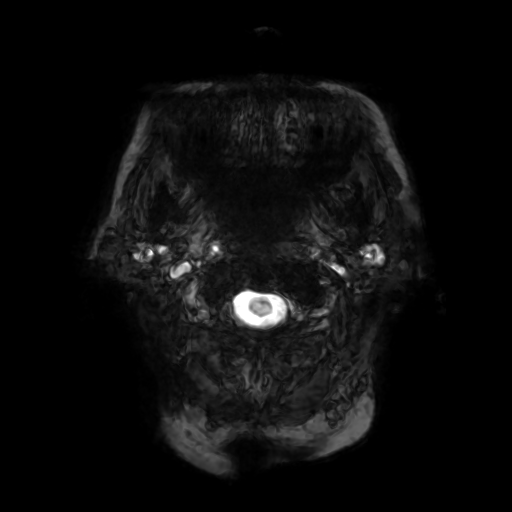
[im 24/143]
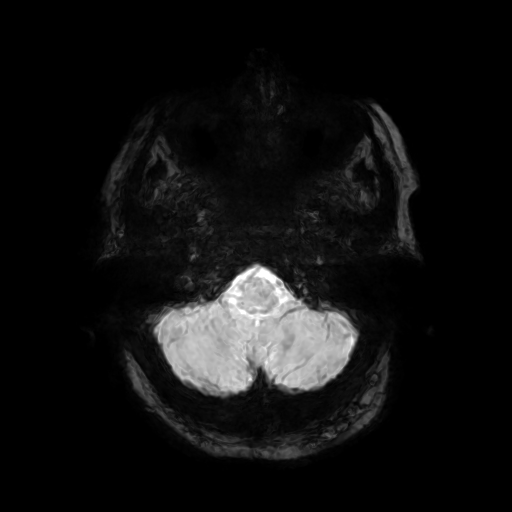
[im 48/143]
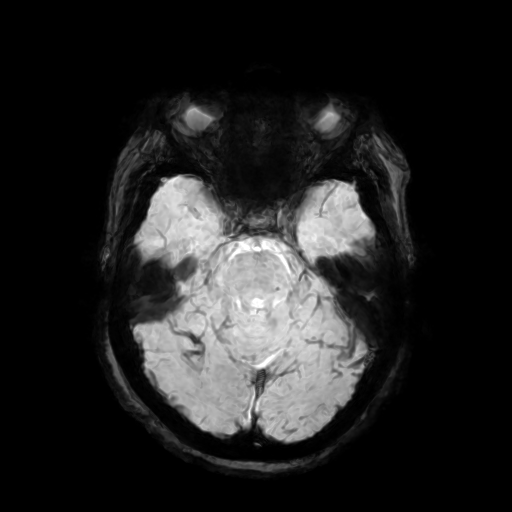
[im 72/143]
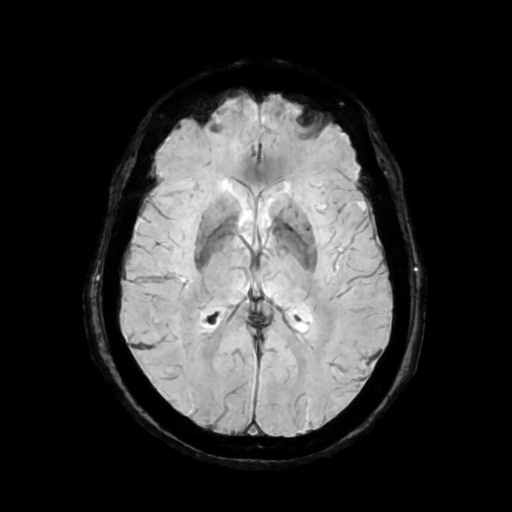
[im 95/143]
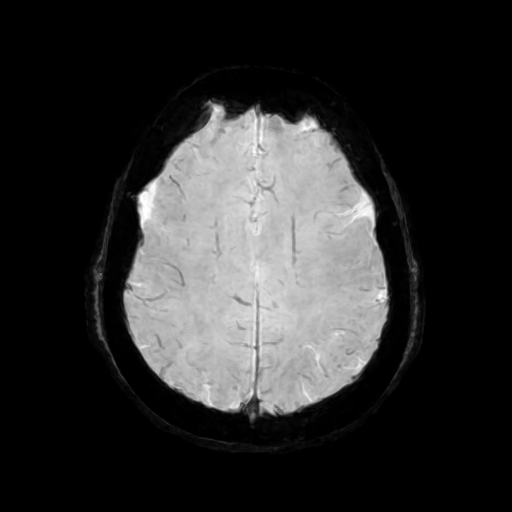
[im 119/143]
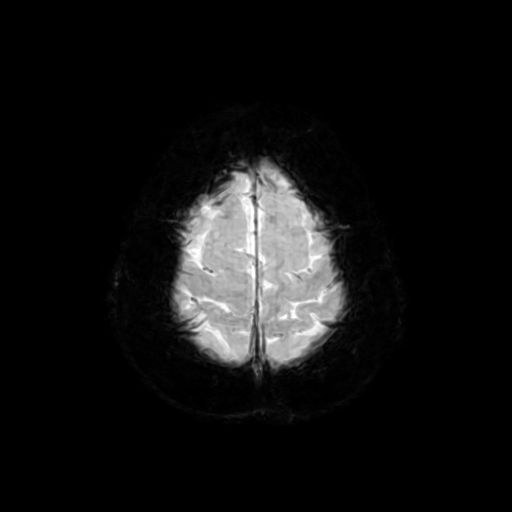
[im 143/143]
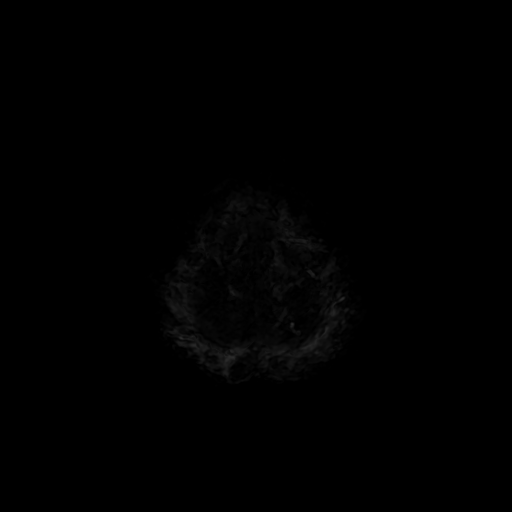

[Series 650: ADC · axial · 5.0mm · 0.94mm/px · 1 of 26 slices shown]
[im 1/26]
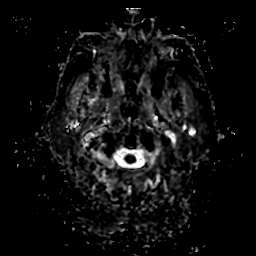

[23 of 48 positions shown; findings below may reference images not displayed]

METODOLOGIA:
Exame realizado com sequências SE (spin-echo), FSE (fast spin-eco), GR (gradiente-eco) e FSE-IR (FLAIR), em planos de cortes
múltiplos, sem a administração intravenosa do agente de contraste paramagnético.
ANÁLISE:
Redução volumétrica encefálica difusa com dilatação compensatória do espaço subaracnoideo.
Focos de hipersinal em T2 distribuídos pela substância branca supratentorial, sem determinar efeito atróﬁco ou expansivo
signiﬁcativo, sugerindo gliose por microangiopatia.
RESSONÂNCIA MAGNÉTICA DO ENCÉFALO
Ausência de hidrocefalia hipertensiva.
Tronco cerebral e cerebelo apresentando forma e intensidade de sinal normal.
Não há sinais de processo expansivo intracraniano.
Corpo caloso de morfologia e espessuras normais.
A sequência eco-planar não evidencia focos de restrição à difusão passiva de água.
IMPRESSÃO:
Redução volumétrica encefálica difusa.
Provável gliose por microangiopatia na substância branca supratentorial.
[facility] [id number] [id number], [place] - [place]
